# Patient Record
Sex: Male | Born: 1995 | Race: White | Hispanic: No | Marital: Single | Smoking: Never smoker
Health system: Southern US, Community
[De-identification: ages and names within clinical notes are randomized; demographics above are authoritative.]

## PROBLEM LIST (undated history)

## (undated) DIAGNOSIS — J45909 Unspecified asthma, uncomplicated: Secondary | ICD-10-CM

## (undated) DIAGNOSIS — F329 Major depressive disorder, single episode, unspecified: Secondary | ICD-10-CM

## (undated) DIAGNOSIS — F32A Depression, unspecified: Secondary | ICD-10-CM

## (undated) DIAGNOSIS — M419 Scoliosis, unspecified: Secondary | ICD-10-CM

## (undated) DIAGNOSIS — R102 Pelvic and perineal pain: Secondary | ICD-10-CM

## (undated) HISTORY — DX: Unspecified asthma, uncomplicated: J45.909

## (undated) HISTORY — DX: Depression, unspecified: F32.A

---
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
## 2017-06-07 IMAGING — US US SCROTUM
1 series · 14 of 25 positions shown · non-contrast
Comparison: No priors.

CLINICAL DATA: 19-year-old male complaining of left-sided
testicular pain today for or approximately 30-45 minutes.

EXAM:
ULTRASOUND OF SCROTUM
TECHNIQUE: Complete ultrasound examination of the testicles, epididymis, and
other scrotal structures was performed.

[Series 1: us scrotum · 0.08mm/px · 14 of 101 slices shown]
[im 1/101]
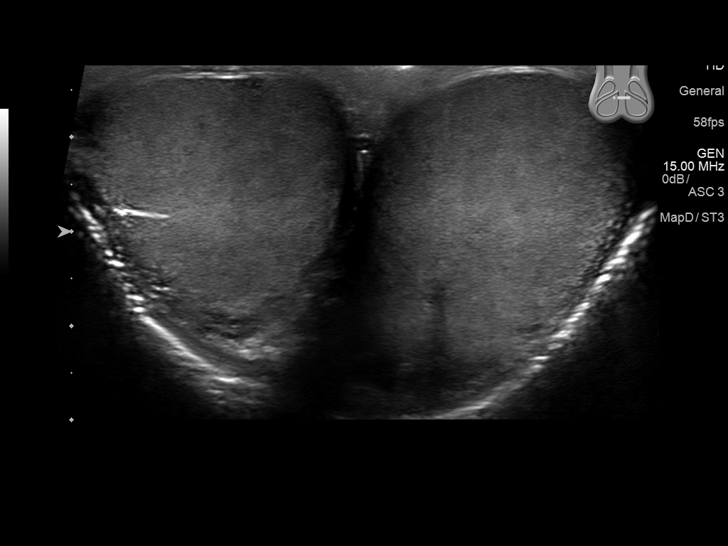
[im 9/101]
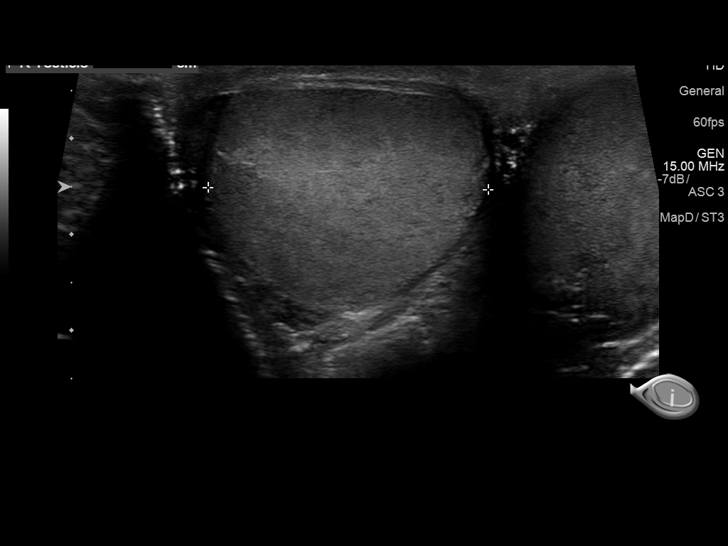
[im 17/101]
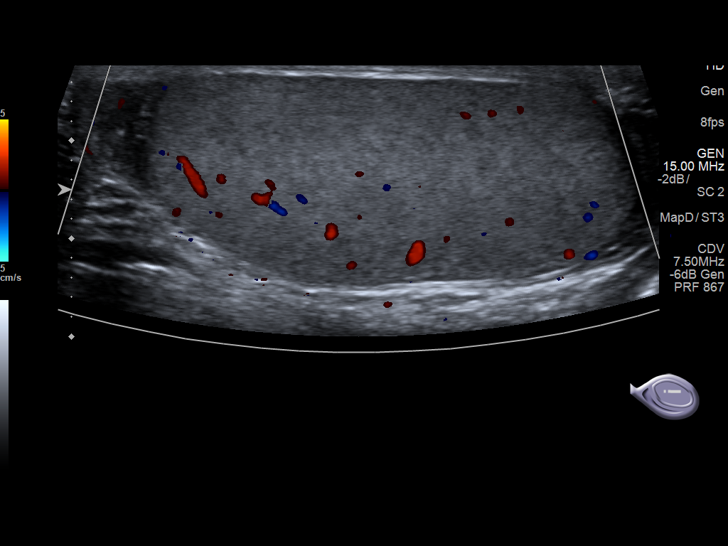
[im 26/101]
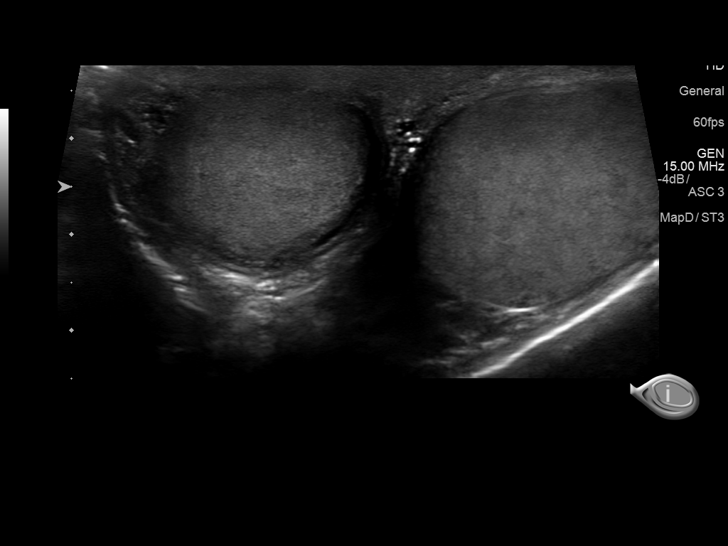
[im 34/101]
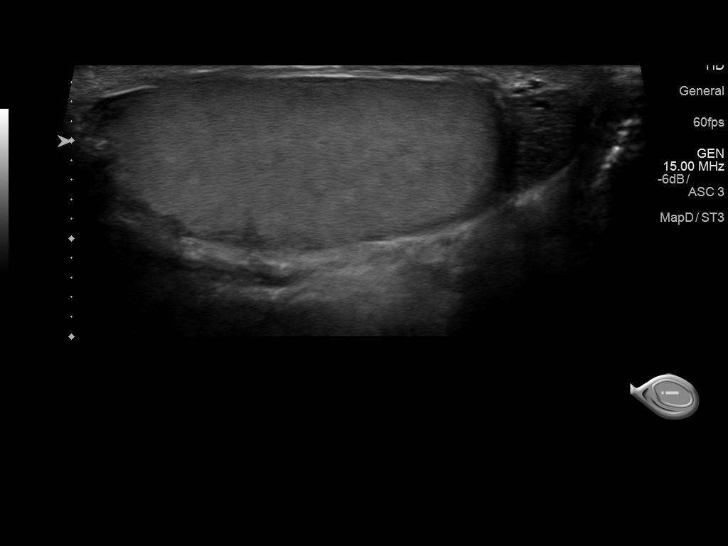
[im 38/101]
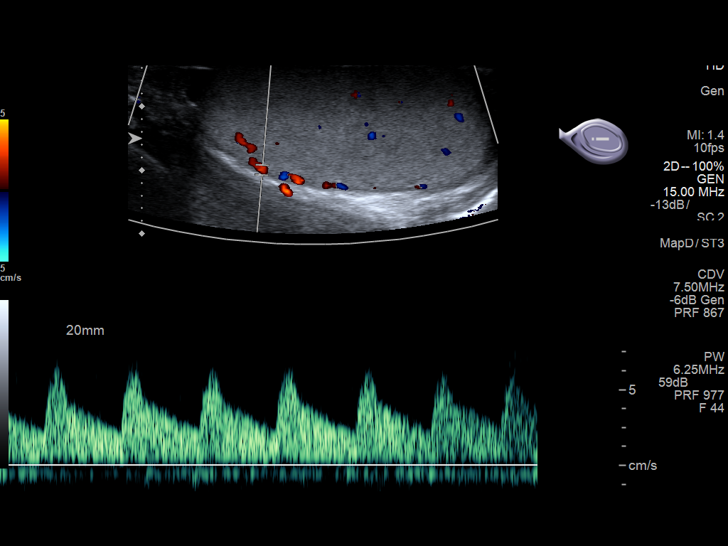
[im 46/101]
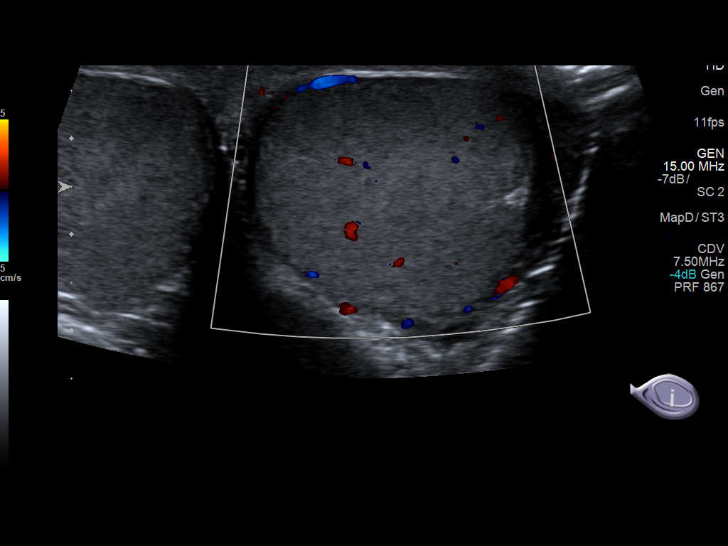
[im 55/101]
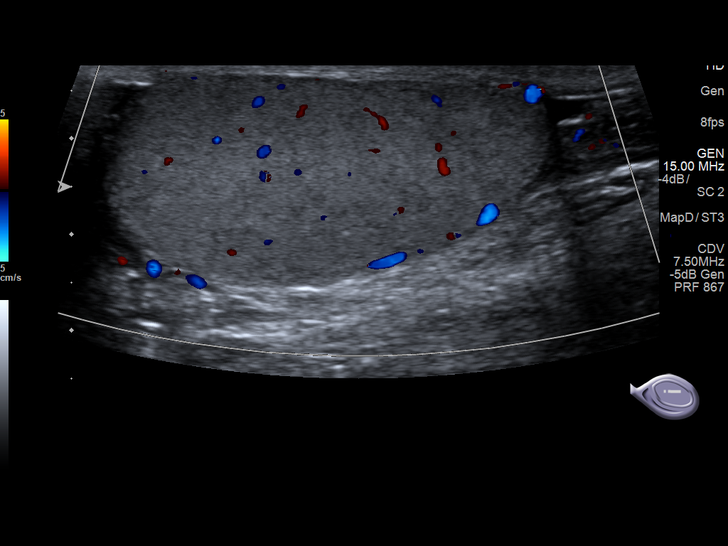
[im 63/101]
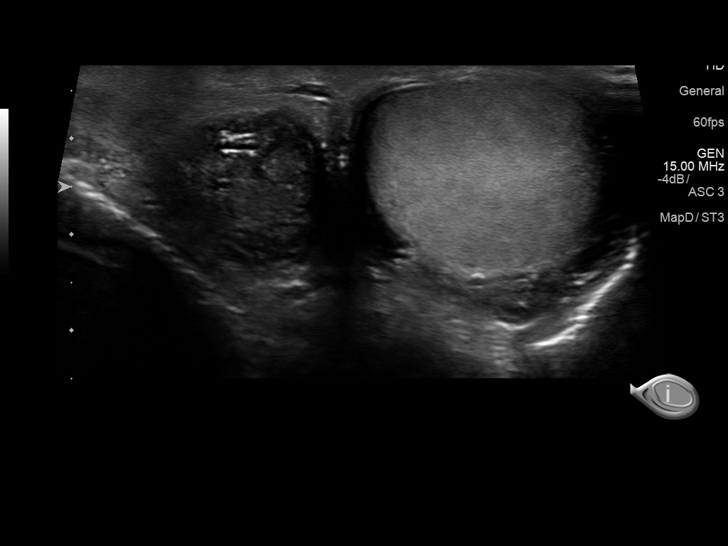
[im 67/101]
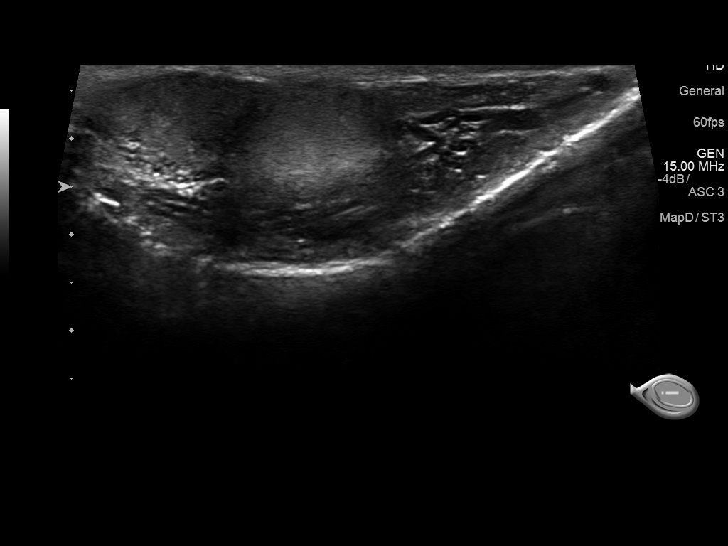
[im 76/101]
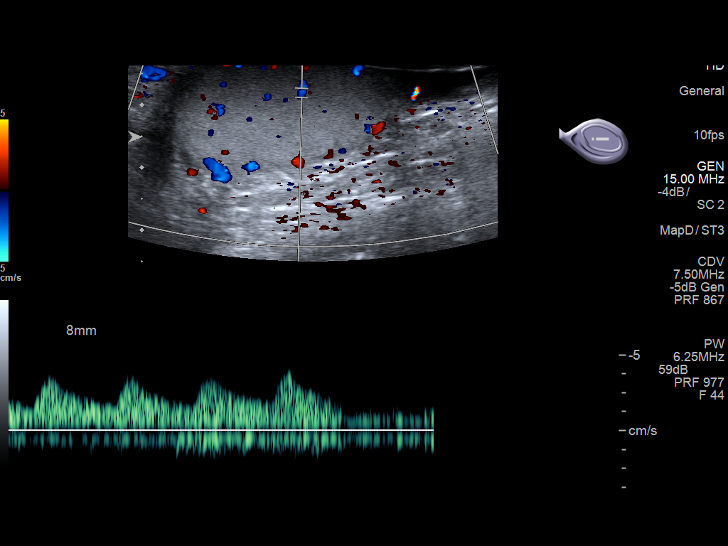
[im 84/101]
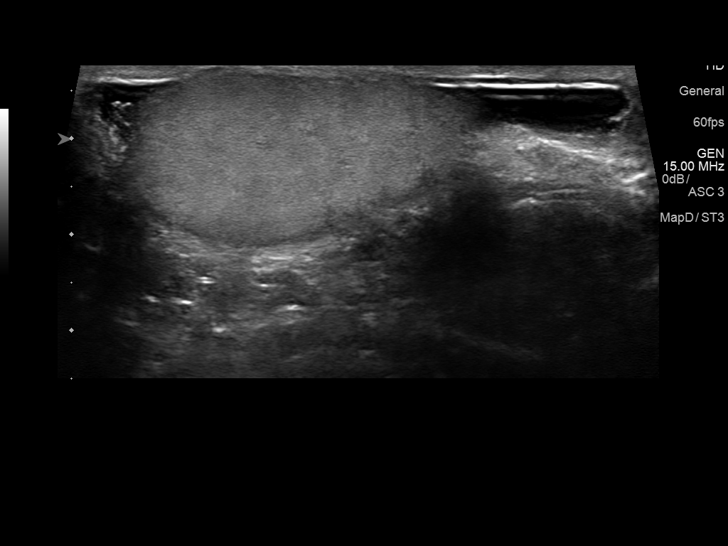
[im 92/101]
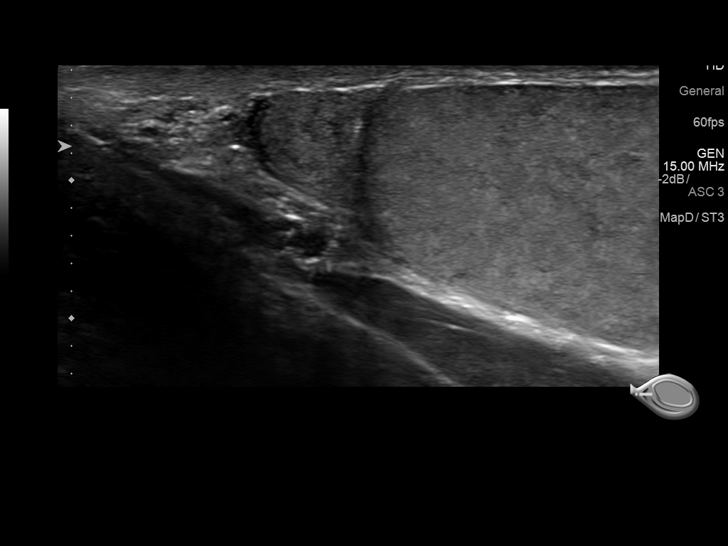
[im 101/101]
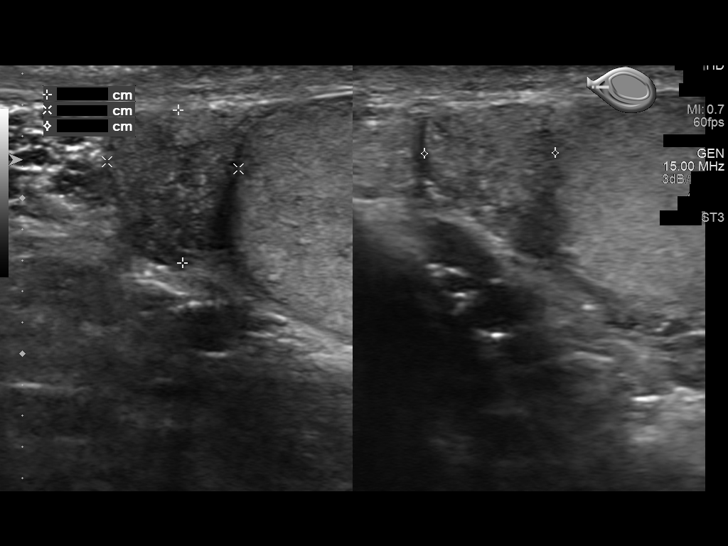

[14 of 25 positions shown; findings below may reference images not displayed]

FINDINGS: Right testicle

Measurements: 5.0 x 2.2 x 2.9 cm. No mass or microlithiasis
visualized.

Left testicle

Measurements: 4.9 x 2.1 x 3.0 cm. No mass or microlithiasis
visualized.

Right epididymis:  Normal in size and appearance.

Left epididymis:  Normal in size and appearance.

Hydrocele:  Small bilateral hydroceles (left greater than right).

Varicocele:  None visualized.
IMPRESSION: 1. Small bilateral (left greater than right) hydroceles.
2. No other acute findings. Specifically, no evidence of testicular
torsion or findings to suggest epididymo-orchitis.

## 2017-07-14 IMAGING — US US SCROTUM
1 series · 14 of 25 positions shown · non-contrast
Comparison: Ultrasound April 14, 2015.

CLINICAL DATA: Acute bilateral testicular pain, left greater than
right.

EXAM:
SCROTAL ULTRASOUND
DOPPLER ULTRASOUND OF THE TESTICLES
TECHNIQUE: Complete ultrasound examination of the testicles, epididymis, and
other scrotal structures was performed. Color and spectral Doppler
ultrasound were also utilized to evaluate blood flow to the
testicles.

[Series 1: us scrotum · 0.07mm/px · 14 of 99 slices shown]
[im 1/99]
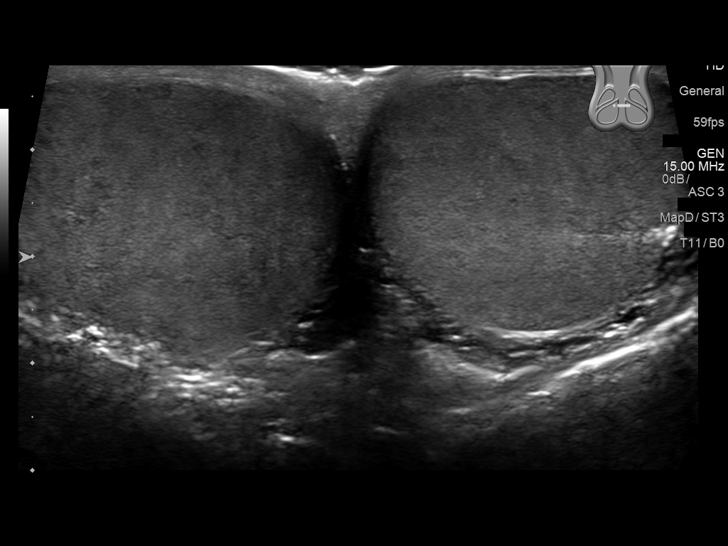
[im 9/99]
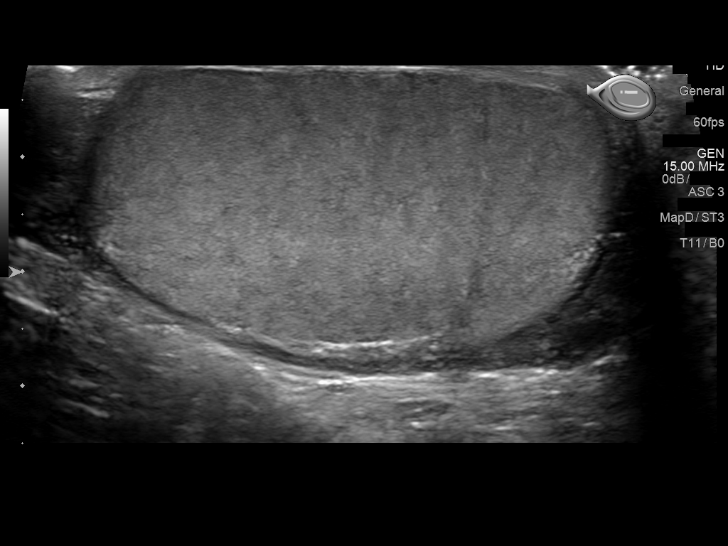
[im 17/99]
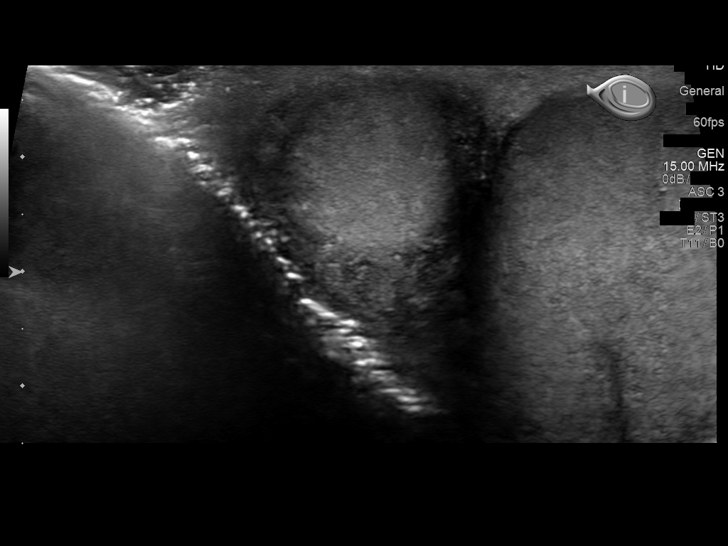
[im 25/99]
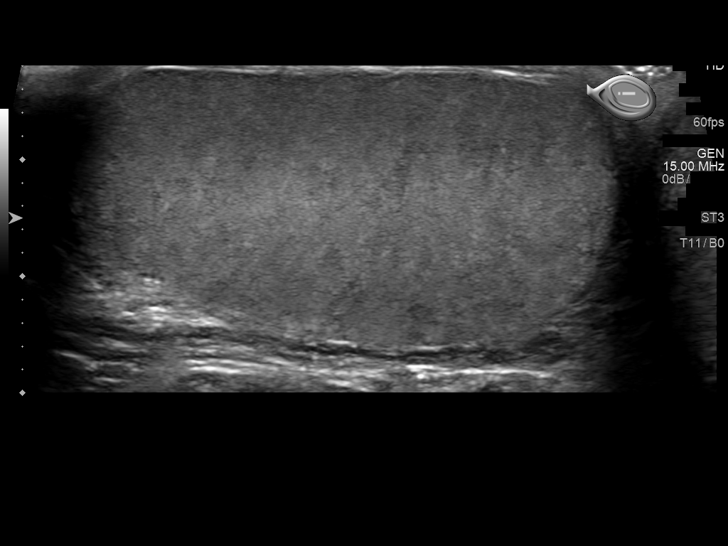
[im 33/99]
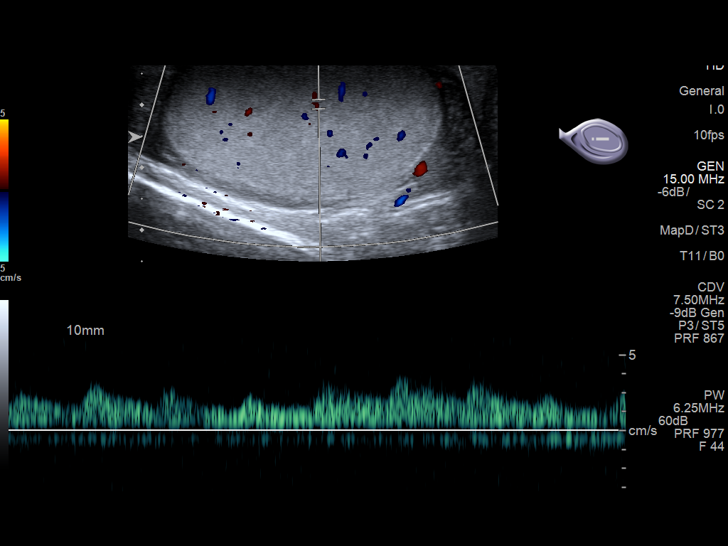
[im 37/99]
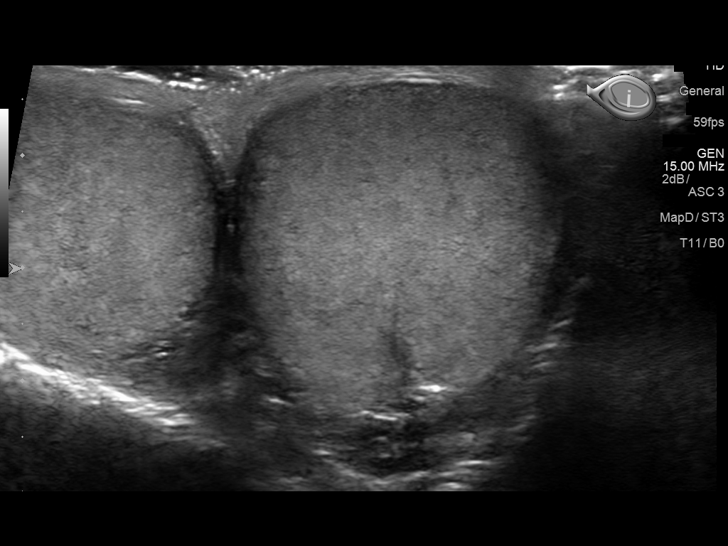
[im 45/99]
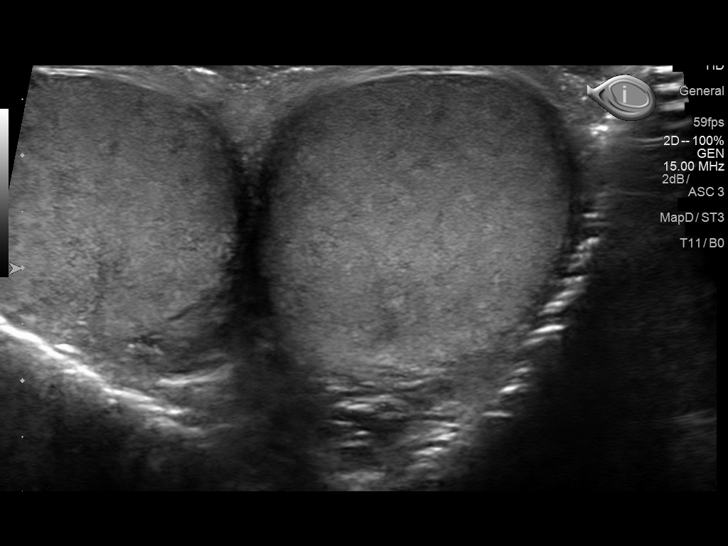
[im 54/99]
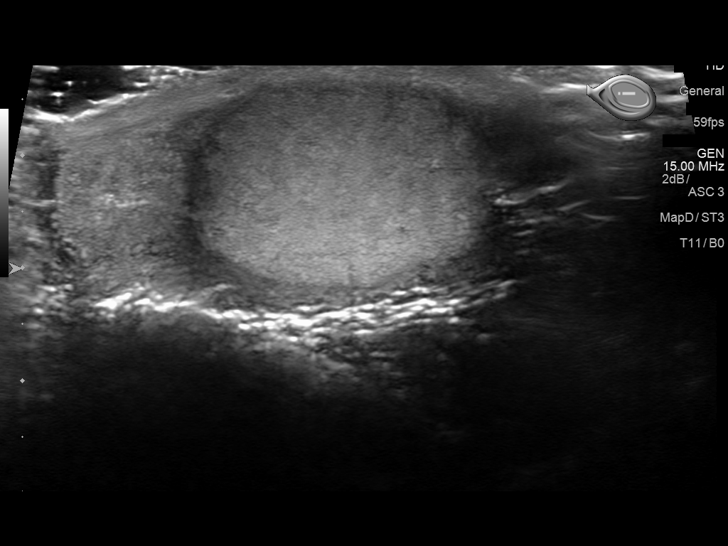
[im 62/99]
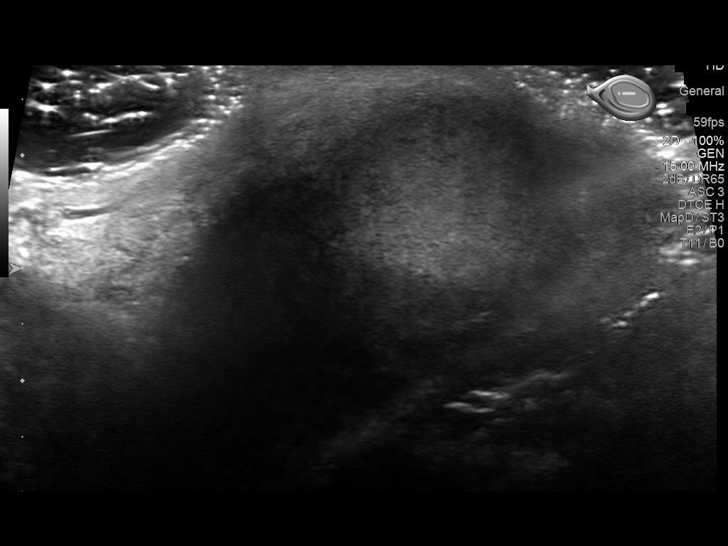
[im 66/99]
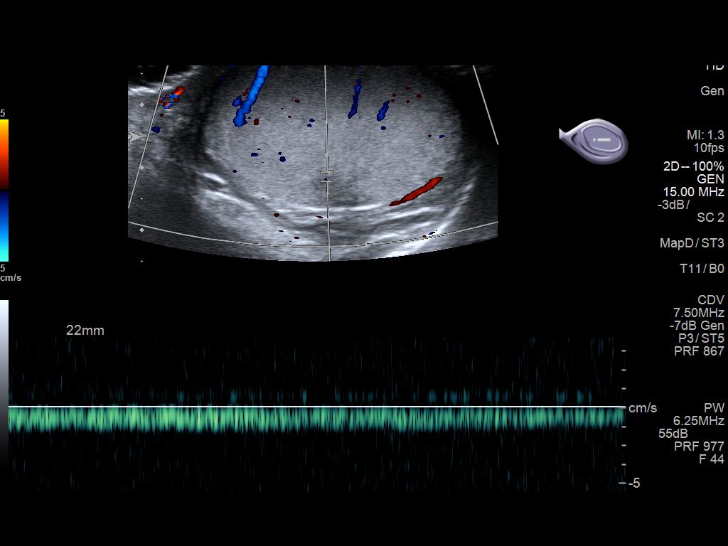
[im 74/99]
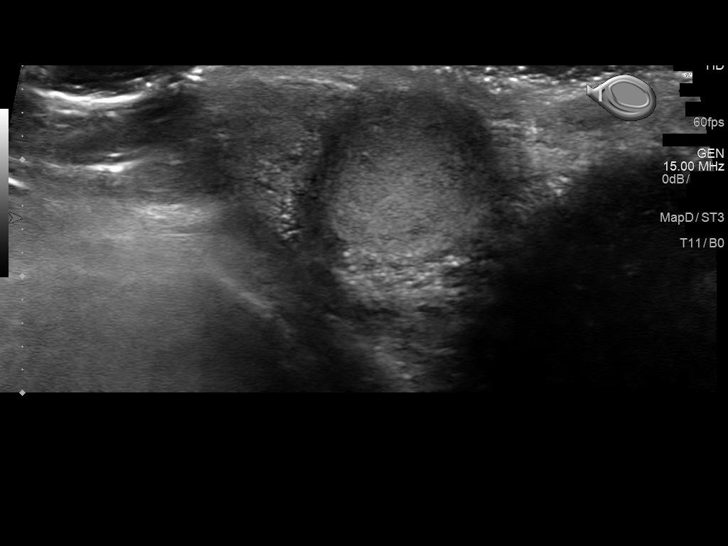
[im 82/99]
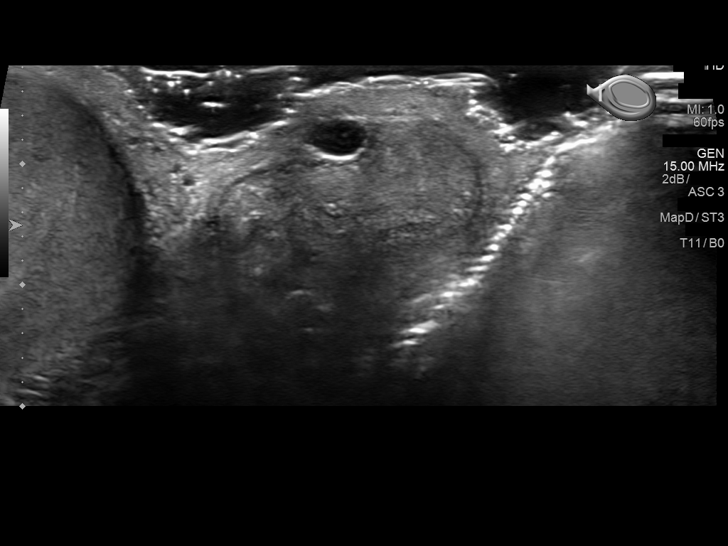
[im 90/99]
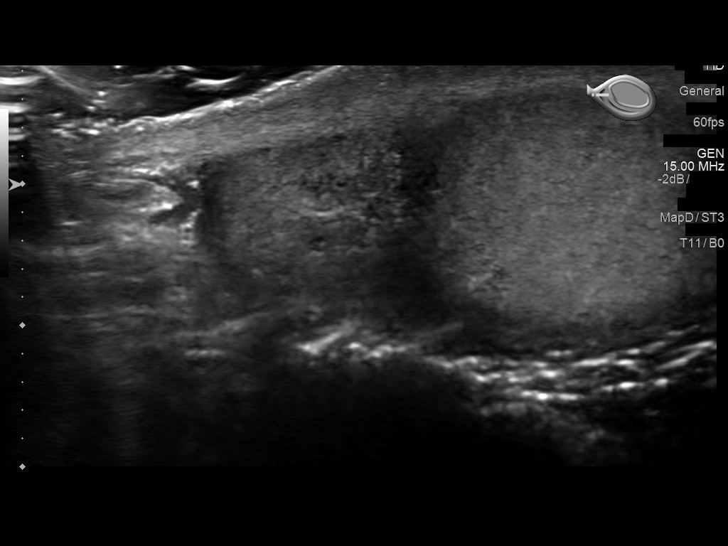
[im 99/99]
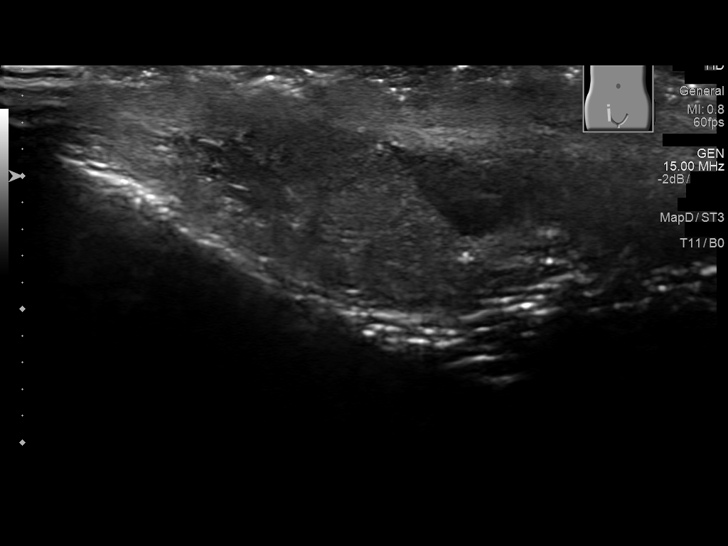

[14 of 25 positions shown; findings below may reference images not displayed]

FINDINGS: Right testicle

Measurements: 4.8 x 3.4 x 2.5 cm. No mass or microlithiasis
visualized.

Left testicle

Measurements: 4.7 x 2.9 x 2.4 cm. No mass or microlithiasis
visualized.

Right epididymis:  Normal in size and appearance.

Left epididymis:  5 mm cyst is noted.

Hydrocele:  None visualized.

Varicocele:  None visualized.

Pulsed Doppler interrogation of both testes demonstrates normal low
resistance arterial and venous waveforms bilaterally.
IMPRESSION: 5 mm left epididymal cyst. No evidence of testicular mass or
torsion.

## 2018-08-19 ENCOUNTER — Telehealth: Payer: Self-pay

## 2018-08-19 IMAGING — CR DG SCOLIOSIS EVAL COMPLETE SPINE 1V
1 series · 4 of 4 positions shown · non-contrast
Comparison: 08/12/2014

CLINICAL DATA: Evaluation for scoliosis.

EXAM:
DG SCOLIOSIS EVAL COMPLETE SPINE 1V

[Series 1: dg scoliosis eval complete spine 1 view · 0.14mm/px · 4 of 4 slices shown]
[im 1/4]
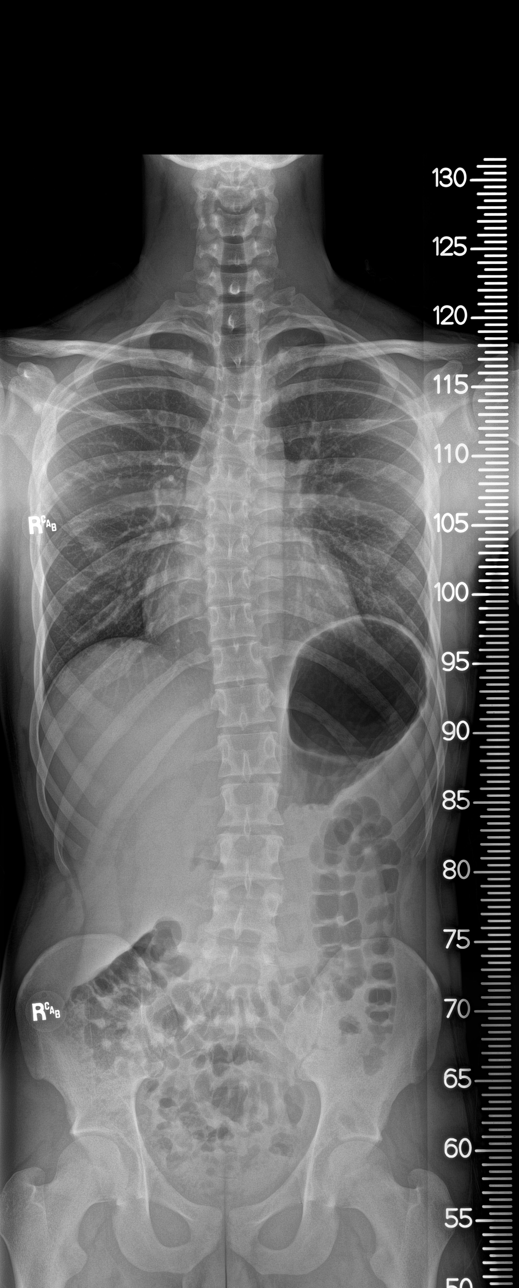
[im 2/4]
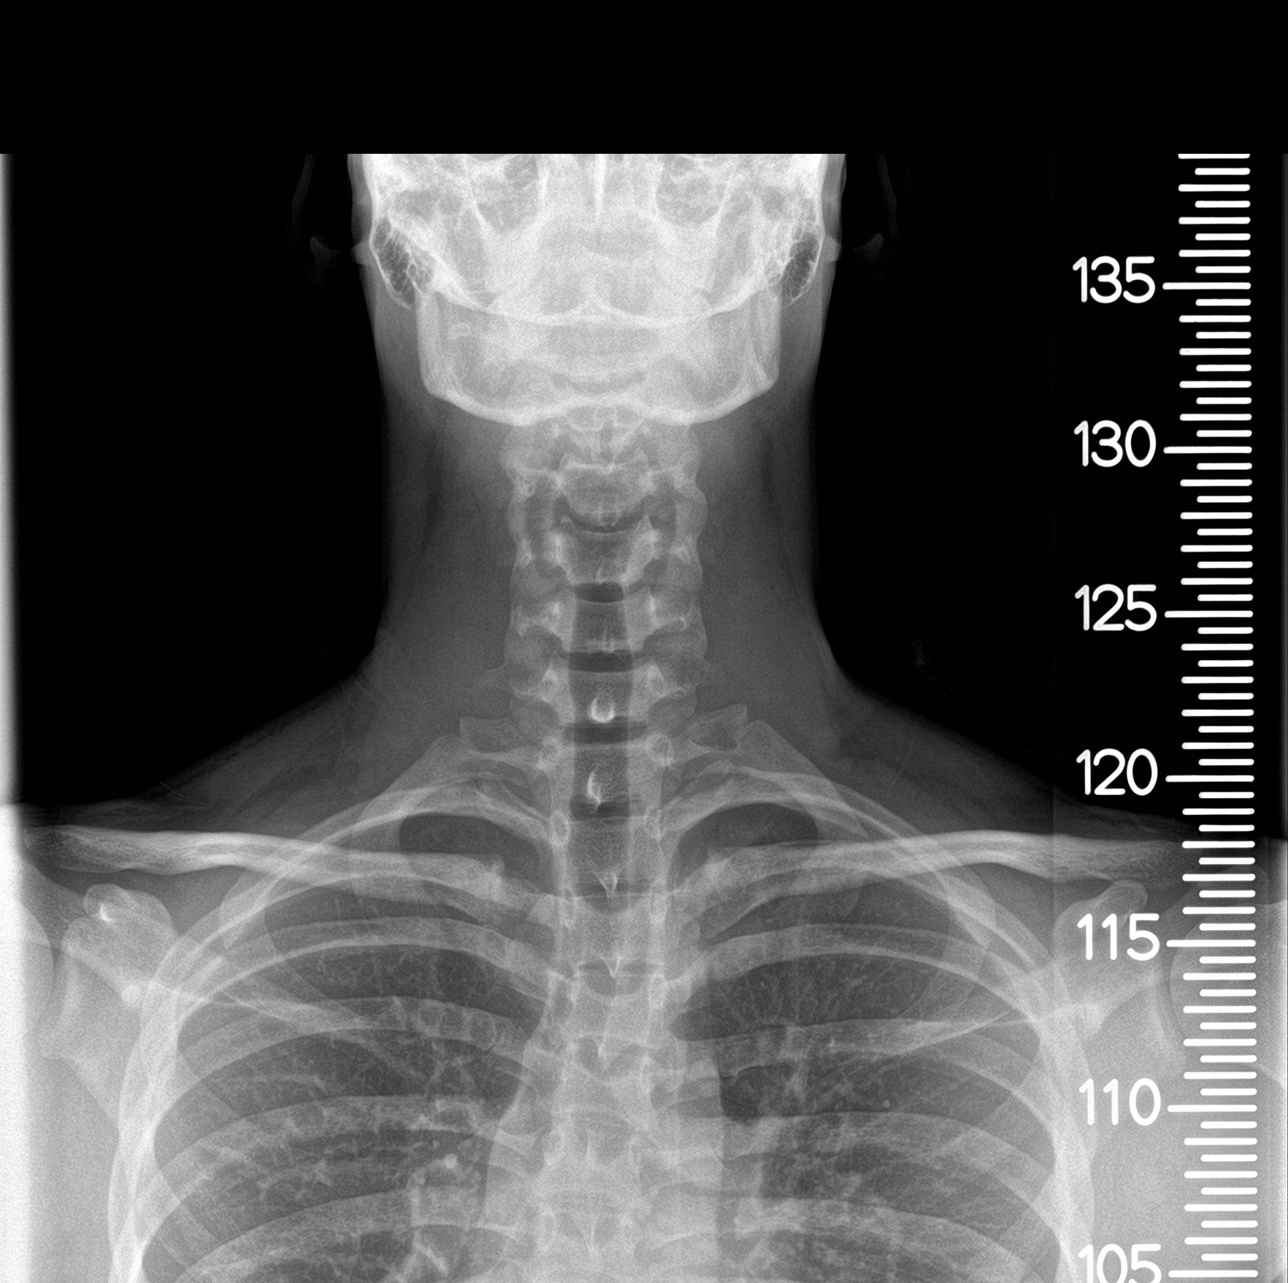
[im 3/4]
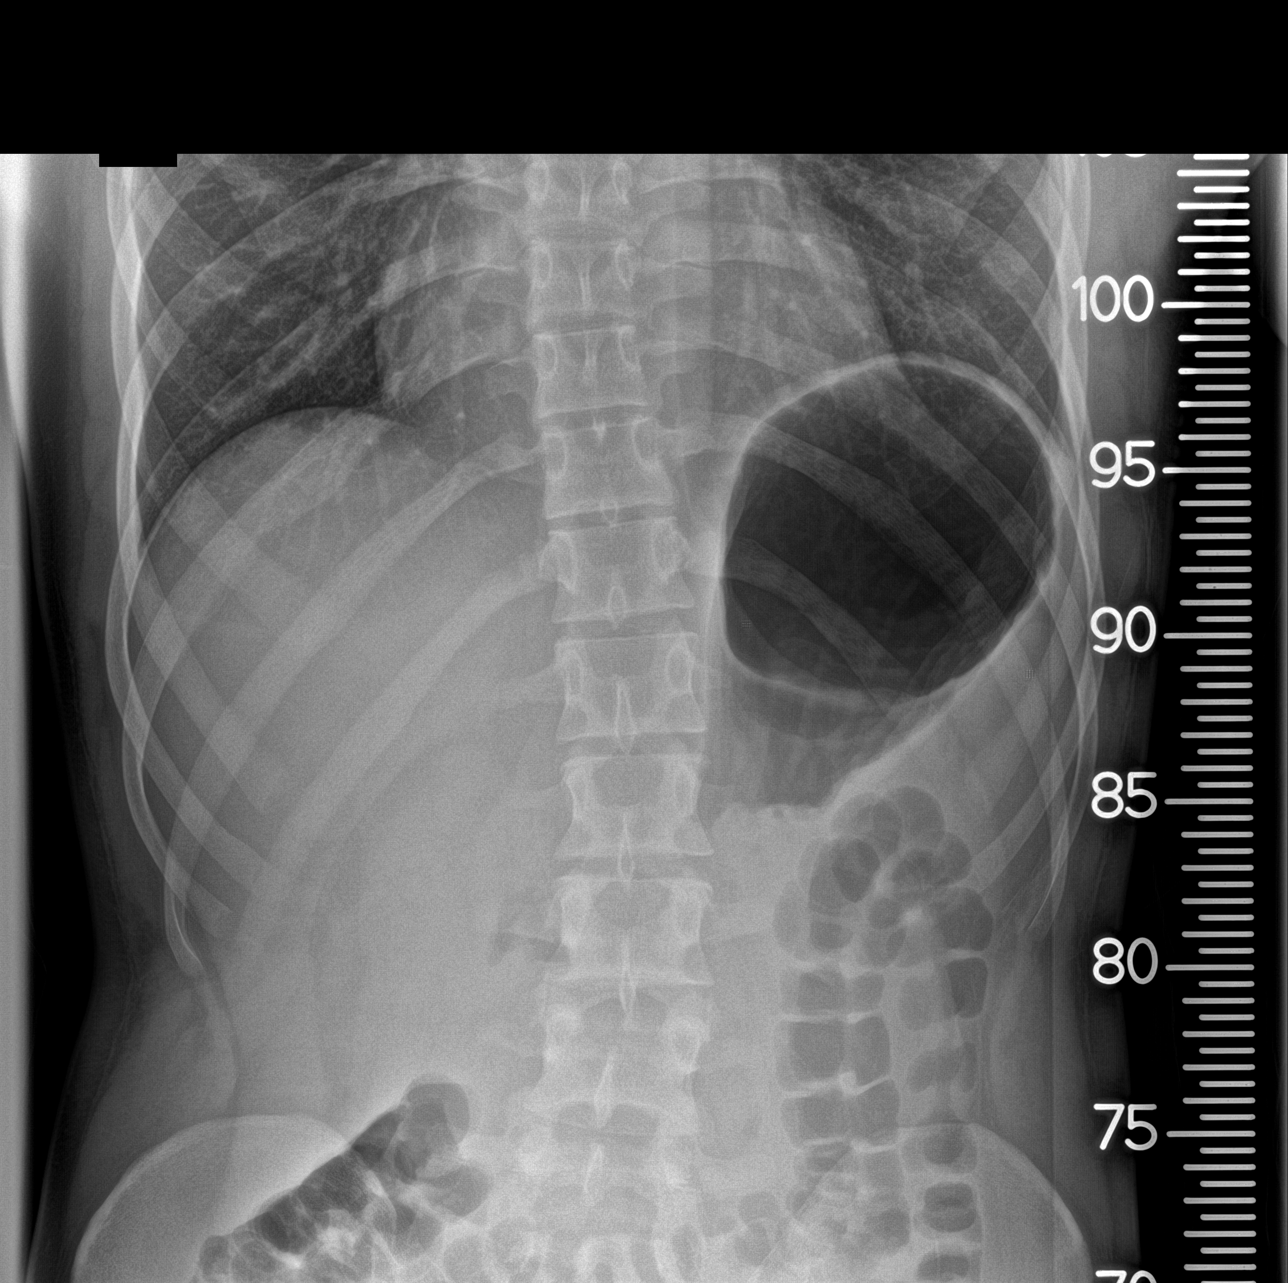
[im 4/4]
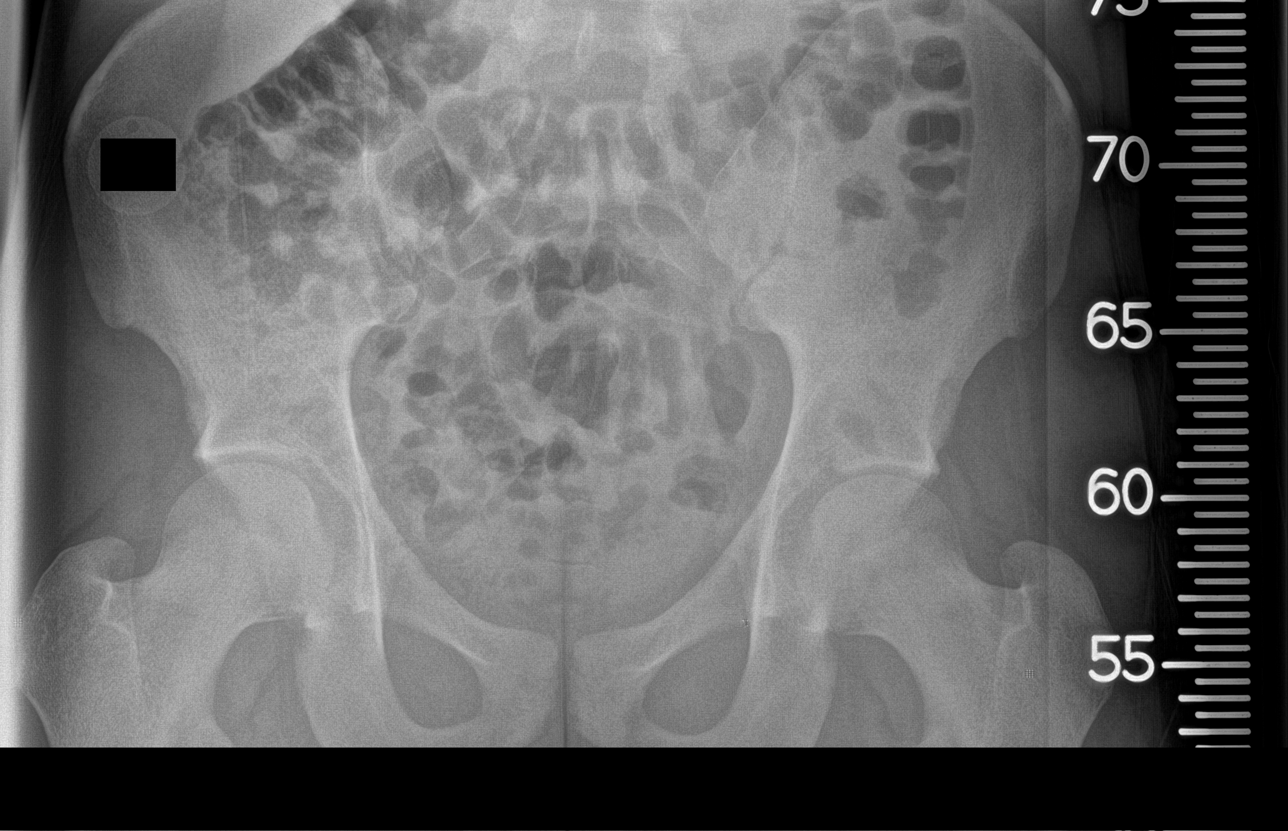

[4 of 4 positions shown; findings below may reference images not displayed]

FINDINGS: Upper thoracic curvature convex to the left with the apex at T3-4
measured at 10 degrees. Midthoracic curvature convex to the right
with the apex at T8-9 measured at 10 degrees. Lumbar curvature
convex to the left with the apex at L3-4 measured at 13 degrees.
These represent minor progression since the examination of [address] thoracic curvatures were in the region of 7-8 degrees and the
lumbar curvature was about 9 degrees.
IMPRESSION: Slight progression of spinal curvature when compared to the
examination July 2014. See above.

## 2018-08-19 NOTE — Telephone Encounter (Signed)
Attempted to contact pt.  No answer.  Vm box not set up yet.  Wanted to remind pt of OV on 08/22/18 at 9:00 AM.

## 2018-08-19 NOTE — Telephone Encounter (Signed)
I think this may be for Joshua Salinas and not me.

## 2018-08-19 NOTE — Telephone Encounter (Signed)
pts father whose name is the same as this pt said he got an automated call about an upcoming appt and pts father does not have upcoming appt on 08/22/18 and thought it might be his son. This pt contact info is [phone] and pt does have appt on 08/22/18 at 9 AM to establish as a new pt with Dr Darnell Level. Sent to [employer] at front desk.

## 2018-08-20 NOTE — Telephone Encounter (Signed)
Spoke with patient did screening for upcoming appointment

## 2018-08-22 ENCOUNTER — Ambulatory Visit: Payer: Self-pay | Admitting: Family Medicine

## 2018-08-22 ENCOUNTER — Encounter: Payer: Self-pay | Admitting: Family Medicine

## 2018-08-22 ENCOUNTER — Other Ambulatory Visit: Payer: Self-pay

## 2018-08-22 VITALS — BP 118/82 | HR 65 | Temp 98.0°F | Ht 71.0 in | Wt 165.0 lb

## 2018-08-22 DIAGNOSIS — Z8659 Personal history of other mental and behavioral disorders: Secondary | ICD-10-CM | POA: Insufficient documentation

## 2018-08-22 DIAGNOSIS — J45909 Unspecified asthma, uncomplicated: Secondary | ICD-10-CM | POA: Insufficient documentation

## 2018-08-22 DIAGNOSIS — R102 Pelvic and perineal pain: Secondary | ICD-10-CM

## 2018-08-22 DIAGNOSIS — J4520 Mild intermittent asthma, uncomplicated: Secondary | ICD-10-CM

## 2018-08-22 DIAGNOSIS — F32A Depression, unspecified: Secondary | ICD-10-CM

## 2018-08-22 DIAGNOSIS — M542 Cervicalgia: Secondary | ICD-10-CM

## 2018-08-22 DIAGNOSIS — Z0000 Encounter for general adult medical examination without abnormal findings: Secondary | ICD-10-CM

## 2018-08-22 DIAGNOSIS — F329 Major depressive disorder, single episode, unspecified: Secondary | ICD-10-CM

## 2018-08-22 NOTE — Assessment & Plan Note (Signed)
Anticipate cervical strain. Conservative measures reviewed at home. Exercise handout provided from [facility] pt advisor. rec trial mobic he has at home (7.5mg ). update if not improving, consider PT referral.

## 2018-08-22 NOTE — Assessment & Plan Note (Signed)
PHQ9 reviewed with patient. No need for meds at this time. Encouraged continue seeing counselor.

## 2018-08-22 NOTE — Progress Notes (Signed)
This visit was conducted in person.  BP 118/82 (BP Location: Left Arm, Patient Position: Sitting, Cuff Size: Normal)   Pulse 65   Temp 98 F (36.7 C) (Tympanic)   Ht 5\' 11"  (1.803 m)   Wt 165 lb (74.8 kg)   SpO2 97%   BMI 23.01 kg/m    CC: new pt to establish care Subjective:    Patient ID: Joshua Salinas, male    DOB: [birth year], 23 y.o.   MRN: [id number]  HPI: Joshua Salinas is a 23 y.o. male presenting on 08/22/2018 for New Patient (Initial Visit) (Wants to establish care. )   Intermittent neck pain for the past month attributes to poor sleep - R>L neck pain into skull. Denies inciting trauma/injury or falls. Has tried massaging with tennis ball, foam rolling with some temporary benefit. Sleeps on stomach.   H/o bilat orchalgia, pelvic pain seeing [facility] urology Dr Kirke Corin for this on flomax planned PFPT. Flomax significantly helps. Hasn't tried meloxicam.   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   Preventative: Seat belt use  Sunscreen use discussed. No changing moles on skin. Non smoker Alcohol - none No rec drugs Dentist q6 mo Eye exam - has not seen Not currently sexually active.   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     Relevant past medical, surgical, family and social history reviewed and updated as indicated. Interim medical history since our last visit reviewed. Allergies and medications reviewed and updated. Outpatient Medications Prior to Visit  Medication Sig Dispense Refill  . Albuterol Sulfate 108 (90 Base) MCG/ACT AEPB Inhale into the lungs every 4 (four) hours as needed. Inhale 1-2 puffs    . tamsulosin (FLOMAX) 0.4 MG CAPS capsule Take 1 capsule by mouth daily as needed.     No  facility-administered medications prior to visit.      Per HPI unless specifically indicated in ROS section below Review of Systems  Constitutional: Negative for activity change, appetite change, chills, fatigue, fever and unexpected weight change.  HENT: Negative for hearing loss.   Eyes: Negative for visual disturbance.  Respiratory: Negative for cough, chest tightness, shortness of breath and wheezing.   Cardiovascular: Negative for chest pain, palpitations and leg swelling.  Gastrointestinal: Negative for abdominal distention, abdominal pain, blood in stool, constipation, diarrhea, nausea and vomiting.  Genitourinary: Negative for difficulty urinating and hematuria.  Musculoskeletal: Negative for arthralgias, myalgias and neck pain.  Skin: Negative for rash.  Neurological: Negative for dizziness, seizures, syncope and headaches.  Hematological: Negative for adenopathy. Does not bruise/bleed easily.  Psychiatric/Behavioral: Negative for dysphoric mood. The patient is not nervous/anxious.    Objective:    BP 118/82 (BP Location: Left Arm, Patient Position: Sitting, Cuff Size: Normal)   Pulse 65   Temp 98 F (36.7 C) (Tympanic)   Ht 5\' 11"  (1.803 m)   Wt 165 lb (74.8 kg)   SpO2 97%   BMI 23.01 kg/m   Wt Readings from Last 3 Encounters:  08/22/18 165 lb (74.8 kg)  05/21/15 144 lb (65.3 kg) (47 %, Z= -0.08)*   * Growth percentiles are based on CDC (Boys, 2-20 Years) data.    Physical Exam Vitals signs and nursing note reviewed.  Constitutional:  General: He is not in acute distress.    Appearance: Normal appearance. He is well-developed. He is not ill-appearing.  HENT:     Head: Normocephalic and atraumatic.     Right Ear: Hearing, tympanic membrane, ear canal and external ear normal.     Left Ear: Hearing, tympanic membrane, ear canal and external ear normal.     Nose: Nose normal.     Mouth/Throat:     Mouth: Mucous membranes are moist.     Pharynx: Uvula midline.  No oropharyngeal exudate or posterior oropharyngeal erythema.  Eyes:     General: No scleral icterus.    Extraocular Movements: Extraocular movements intact.     Conjunctiva/sclera: Conjunctivae normal.     Pupils: Pupils are equal, round, and reactive to light.  Neck:     Musculoskeletal: Normal range of motion and neck supple.     Comments: FROM at neck with mild discomfort to palpation L mid trap muscle Cardiovascular:     Rate and Rhythm: Normal rate and regular rhythm.     Pulses: Normal pulses.          Radial pulses are 2+ on the right side and 2+ on the left side.     Heart sounds: Normal heart sounds. No murmur.  Pulmonary:     Effort: Pulmonary effort is normal. No respiratory distress.     Breath sounds: Normal breath sounds. No wheezing, rhonchi or rales.  Abdominal:     General: Bowel sounds are normal. There is no distension.     Palpations: Abdomen is soft. There is no mass.     Tenderness: There is no abdominal tenderness. There is no guarding or rebound.  Musculoskeletal: Normal range of motion.     Right lower leg: No edema.     Left lower leg: No edema.  Lymphadenopathy:     Cervical: No cervical adenopathy.  Skin:    General: Skin is warm and dry.     Findings: No rash.  Neurological:     General: No focal deficit present.     Mental Status: He is alert and oriented to person, place, and time.     Comments: CN grossly intact, station and gait intact  Psychiatric:        Mood and Affect: Mood normal.        Behavior: Behavior normal.        Thought Content: Thought content normal.        Judgment: Judgment normal.       Results for orders placed or performed during the hospital encounter of 08/13/14  CBC with Differential/Platelet  Result Value Ref Range   WBC 5.2 3.8 - 10.6 K/uL   RBC 5.45 4.40 - 5.90 MIL/uL   Hemoglobin 16.4 13.0 - 18.0 g/dL   HCT 78.448.4 69.640.0 - 29.552.0 %   MCV 88.8 80.0 - 100.0 fL   MCH 30.0 26.0 - 34.0 pg   MCHC 33.8 32.0 - 36.0 g/dL    RDW 28.413.0 13.211.5 - 44.014.5 %   Platelets 203 150 - 440 K/uL   Neutrophils Relative % 56 %   Neutro Abs 2.9 1.4 - 6.5 K/uL   Lymphocytes Relative 33 %   Lymphs Abs 1.7 1.0 - 3.6 K/uL   Monocytes Relative 8 %   Monocytes Absolute 0.4 0.2 - 1.0 K/uL   Eosinophils Relative 2 %   Eosinophils Absolute 0.1 0 - 0.7 K/uL   Basophils Relative 1 %   Basophils Absolute 0.0 0 -  0.1 K/uL  Lipid panel  Result Value Ref Range   Cholesterol 142 0 - 169 mg/dL   Triglycerides 960157 (H) <150 mg/dL   HDL 33 (L) >45>40 mg/dL   Total CHOL/HDL Ratio 4.3 RATIO   VLDL 31 0 - 40 mg/dL   LDL Cholesterol 78 0 - 99 mg/dL  Urinalysis complete, with microscopic ([facility] only)  Result Value Ref Range   Color, Urine YELLOW YELLOW   APPearance CLEAR CLEAR   Glucose, UA NEGATIVE NEGATIVE mg/dL   Bilirubin Urine NEGATIVE NEGATIVE   Ketones, ur NEGATIVE NEGATIVE mg/dL   Specific Gravity, Urine 1.025 1.005 - 1.030   Hgb urine dipstick NEGATIVE NEGATIVE   pH 5.5 5.0 - 8.0   Protein, ur NEGATIVE NEGATIVE mg/dL   Nitrite NEGATIVE NEGATIVE   Leukocytes, UA NEGATIVE NEGATIVE   RBC / HPF NONE SEEN <3 RBC/hpf   WBC, UA 0-5 <3 WBC/hpf   Bacteria, UA NONE SEEN (A) RARE   Squamous Epithelial / LPF 0-5 (A) RARE   Mucus PRESENT    Assessment & Plan:   Problem List Items Addressed This Visit    Pelvic pain    [facility]. Continue flomax PRN. Considering PFPT      Neck pain    Anticipate cervical strain. Conservative measures reviewed at home. [facility]. rec trial mobic he has at home (7.5mg ). update if not improving, consider PT referral.       Health maintenance examination - Primary    Preventative protocols reviewed and updated unless pt declined. Discussed healthy diet and lifestyle.       Depression    PHQ9 reviewed with patient. No need for meds at this time. Encouraged continue seeing counselor.       Asthma   Relevant Medications   Albuterol Sulfate 108  (90 Base) MCG/ACT AEPB       No orders of the defined types were placed in this encounter.  No orders of the defined types were placed in this encounter.   Follow up plan: Return in about 1 year (around 08/22/2019).  Eustaquio BoydenJavier Onesimo Lingard, MD

## 2018-08-22 NOTE — Patient Instructions (Signed)
Good to meet you today Neck stretching exercises provided today Try meloxicam with food once daily for next 5-7 days. May use heating pad to neck, covered in a towel 10 min 2-3 times a day.  Let us know if not improving with this. Return as needed or in 1 year for next physical.   Preventive Care 24-21 Years Old, Male Preventive care refers to lifestyle choices and visits with your health care provider that can promote health and wellness. At this stage in your life, you may start seeing a primary care physician instead of a pediatrician. Your health care is now your responsibility. Preventive care for young adults includes:  A yearly physical exam. This is also called an annual wellness visit.  Regular dental and eye exams.  Immunizations.  Screening for certain conditions.  Healthy lifestyle choices, such as diet and exercise. What can I expect for my preventive care visit? Physical exam Your health care provider may check:  Height and weight. These may be used to calculate body mass index (BMI), which is a measurement that tells if you are at a healthy weight.  Heart rate and blood pressure.  Body temperature. Counseling Your health care provider may ask you questions about:  Past medical problems and family medical history.  Alcohol, tobacco, and drug use.  Home and relationship well-being.  Access to firearms.  Emotional well-being.  Diet, exercise, and sleep habits.  Sexual activity and sexual health. What immunizations do I need?  Influenza (flu) vaccine  This is recommended every year. Tetanus, diphtheria, and pertussis (Tdap) vaccine  You may need a Td booster every 10 years. Varicella (chickenpox) vaccine  You may need this vaccine if you have not already been vaccinated. Human papillomavirus (HPV) vaccine  If recommended by your health care provider, you may need three doses over 6 months. Measles, mumps, and rubella (MMR) vaccine  You may need at  least one dose of MMR. You may also need a second dose. Meningococcal conjugate (MenACWY) vaccine  One dose is recommended if you are 44-70 years old and a Market researcher living in a residence hall, or if you have one of several medical conditions. You may also need additional booster doses. Pneumococcal conjugate (PCV13) vaccine  You may need this if you have certain conditions and were not previously vaccinated. Pneumococcal polysaccharide (PPSV23) vaccine  You may need one or two doses if you smoke cigarettes or if you have certain conditions. Hepatitis A vaccine  You may need this if you have certain conditions or if you travel or work in places where you may be exposed to hepatitis A. Hepatitis B vaccine  You may need this if you have certain conditions or if you travel or work in places where you may be exposed to hepatitis B. Haemophilus influenzae type b (Hib) vaccine  You may need this if you have certain risk factors. You may receive vaccines as individual doses or as more than one vaccine together in one shot (combination vaccines). Talk with your health care provider about the risks and benefits of combination vaccines. What tests do I need? Blood tests  Lipid and cholesterol levels. These may be checked every 5 years starting at age 60.  Hepatitis C test.  Hepatitis B test. Screening  Genital exam to check for testicular cancer or hernias.  Sexually transmitted disease (STD) testing, if you are at risk. Other tests  Tuberculosis skin test.  Vision and hearing tests.  Skin exam. Follow these instructions  at home: Eating and drinking   Eat a diet that includes fresh fruits and vegetables, whole grains, lean protein, and low-fat dairy products.  Drink enough fluid to keep your urine pale yellow.  Do not drink alcohol if: ? Your health care provider tells you not to drink. ? You are under the legal drinking age. In the [place] the legal drinking  age is 13.  If you drink alcohol: ? Limit how much you have to 0-2 drinks a day. ? Be aware of how much alcohol is in your drink. In the [place] one drink equals one 12 oz bottle of beer (355 mL), one 5 oz glass of wine (148 mL), or one 1 oz glass of hard liquor (44 mL). Lifestyle  Take daily care of your teeth and gums.  Stay active. Exercise at least 30 minutes 5 or more days of the week.  Do not use any products that contain nicotine or tobacco, such as cigarettes, e-cigarettes, and chewing tobacco. If you need help quitting, ask your health care provider.  Do not use drugs.  If you are sexually active, practice safe sex. Use a condom or other form of protection to prevent STIs (sexually transmitted infections).  Find healthy ways to cope with stress, such as: ? Meditation, yoga, or listening to music. ? Journaling. ? Talking to a trusted person. ? Spending time with friends and family. Safety  Always wear your seat belt while driving or riding in a vehicle.  Do not drive if you have been drinking alcohol.  Do not ride with someone who has been drinking.  Do not drive when you are tired or distracted.  Do not text while driving.  Wear a helmet and other protective equipment during sports activities.  If you have firearms in your house, make sure you follow all gun safety procedures.  Seek help if you have been bullied, physically abused, or sexually abused.  Use the Internet responsibly to avoid dangers such as online bullying and online sex predators. What's next?  Go to your health care provider once a year for a well check visit.  Ask your health care provider how often you should have your eyes and teeth checked.  Stay up to date on all vaccines. This information is not intended to replace advice given to you by your health care provider. Make sure you discuss any questions you have with your health care provider. Document Released: 06/17/2015 Document Revised:  01/24/2018 Document Reviewed: 01/24/2018 Elsevier Patient Education  2020 [employer].

## 2018-08-22 NOTE — Assessment & Plan Note (Signed)
Preventative protocols reviewed and updated unless pt declined. Discussed healthy diet and lifestyle.  

## 2018-08-22 NOTE — Assessment & Plan Note (Addendum)
Has seen [facility]) urology. Continue flomax PRN. Considering PFPT

## 2018-08-28 ENCOUNTER — Encounter: Payer: Self-pay | Admitting: Family Medicine

## 2018-08-28 DIAGNOSIS — M419 Scoliosis, unspecified: Secondary | ICD-10-CM

## 2018-08-28 DIAGNOSIS — M4120 Other idiopathic scoliosis, site unspecified: Secondary | ICD-10-CM | POA: Insufficient documentation

## 2018-08-28 HISTORY — DX: Scoliosis, unspecified: M41.9

## 2019-09-17 ENCOUNTER — Telehealth: Payer: Self-pay | Admitting: Family Medicine

## 2019-09-17 NOTE — Telephone Encounter (Signed)
Ok to schedule. No need for labs prior.

## 2019-09-17 NOTE — Telephone Encounter (Signed)
Patient called and needed to get scheduled for CPE and labs. He starts school 10/01/19 and will have class everyday Monday-Friday so it would be extremely hard for him to have time once school starts. There is a 11:30 slot available on 8/13 would it be okay to schedule him there? He is willing to come in for labs ASAP.

## 2019-09-26 ENCOUNTER — Other Ambulatory Visit: Payer: Self-pay

## 2019-09-26 ENCOUNTER — Encounter: Payer: Self-pay | Admitting: Family Medicine

## 2019-09-26 ENCOUNTER — Ambulatory Visit: Payer: Self-pay | Admitting: Family Medicine

## 2019-09-26 VITALS — BP 118/78 | HR 67 | Temp 98.2°F | Ht 71.0 in | Wt 164.0 lb

## 2019-09-26 DIAGNOSIS — Z13220 Encounter for screening for lipoid disorders: Secondary | ICD-10-CM

## 2019-09-26 DIAGNOSIS — R102 Pelvic and perineal pain: Secondary | ICD-10-CM

## 2019-09-26 DIAGNOSIS — Z113 Encounter for screening for infections with a predominantly sexual mode of transmission: Secondary | ICD-10-CM

## 2019-09-26 DIAGNOSIS — Z0000 Encounter for general adult medical examination without abnormal findings: Secondary | ICD-10-CM

## 2019-09-26 DIAGNOSIS — H547 Unspecified visual loss: Secondary | ICD-10-CM | POA: Insufficient documentation

## 2019-09-26 DIAGNOSIS — Z23 Encounter for immunization: Secondary | ICD-10-CM

## 2019-09-26 DIAGNOSIS — J4520 Mild intermittent asthma, uncomplicated: Secondary | ICD-10-CM

## 2019-09-26 DIAGNOSIS — Z131 Encounter for screening for diabetes mellitus: Secondary | ICD-10-CM

## 2019-09-26 DIAGNOSIS — M41125 Adolescent idiopathic scoliosis, thoracolumbar region: Secondary | ICD-10-CM

## 2019-09-26 LAB — BASIC METABOLIC PANEL
BUN: 16 mg/dL (ref 6–23)
CO2: 32 mEq/L (ref 19–32)
Calcium: 10.1 mg/dL (ref 8.4–10.5)
Chloride: 102 mEq/L (ref 96–112)
Creatinine, Ser: 1.06 mg/dL (ref 0.40–1.50)
GFR: 87.44 mL/min (ref >60.00)
Glucose, Bld: 98 mg/dL (ref 70–99)
Potassium: 4.1 mEq/L (ref 3.5–5.1)
Sodium: 140 mEq/L (ref 135–145)

## 2019-09-26 LAB — LIPID PANEL
Cholesterol: 166 mg/dL (ref 0–200)
HDL: 35.7 mg/dL — ABNORMAL LOW
LDL Cholesterol: 93 mg/dL (ref 0–99)
NonHDL: 130.15
Total CHOL/HDL Ratio: 5
Triglycerides: 186 mg/dL — ABNORMAL HIGH (ref 0.0–149.0)
VLDL: 37.2 mg/dL (ref 0.0–40.0)

## 2019-09-26 MED ORDER — ALBUTEROL SULFATE HFA 108 (90 BASE) MCG/ACT IN AERS: 2.0000 | INHALATION_SPRAY | Freq: Four times a day (QID) | RESPIRATORY_TRACT | 3 refills | Status: AC | PRN

## 2019-09-26 NOTE — Assessment & Plan Note (Addendum)
Failed L vision screen - advised he go for formal eye exam to local optometrist as may need glasses.

## 2019-09-26 NOTE — Assessment & Plan Note (Addendum)
Preventative protocols reviewed and updated unless pt declined. Discussed healthy diet and lifestyle.  STD screen today Discussed safe sex.

## 2019-09-26 NOTE — Assessment & Plan Note (Signed)
Exercise/cold induced, rare albuterol - current Rx is expired. Will refill to update at pharmacy.

## 2019-09-26 NOTE — Progress Notes (Signed)
This visit was conducted in person.  BP 118/78 (BP Location: Left Arm, Patient Position: Sitting, Cuff Size: Normal)   Pulse 67   Temp 98.2 F (36.8 C) (Temporal)   Ht 5\' 11"  (1.803 m)   Wt 164 lb (74.4 kg)   SpO2 98%   BMI 22.87 kg/m    Hearing Screening   125Hz  250Hz  500Hz  1000Hz  2000Hz  3000Hz  4000Hz  6000Hz  8000Hz   Right ear:           Left ear:             Visual Acuity Screening   Right eye Left eye Both eyes  Without correction: 20/25 20/50 20/30   With correction:       CC: CPE Subjective:    Patient ID: Joshua Salinas, male    DOB: Oct 06, 1995, 24 y.o.   MRN:  HPI: Joshua Salinas is a 24 y.o. male presenting on 09/26/2019 for Annual Exam   H/o chronic pelvic pain saw [facility]) managed with flomax PRN meloxicam and PFPT. Now only rare flares - managed with flomax PRN.   Starting school next week [employer] - sophomore to . To live in an apartment with roommate. Took the past 18 months off. Traveled country in a [place] with cousins, worked at [employer], worked at 11/[employer].   H/o thoracic scoliosis last measured 3 yrs ago. Max 10 degree curvature noted at that time. No known fmhx scoliosis. He has started shaving regularly. No h/o leg trauma or known leg length discrepancy.  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   Preventative: Tdap 09/2019  COVID vaccine - discussed, declines at this time  No texting and driving.  Seat belt use discussed.  Sunscreen use discussed. No changing moles on skin. Non smoker  Alcohol - rare, no binging  No rec drugs  Dentist q6 mo Eye exam - has not seen - notes more trouble with distance vision  Not currently sexually active. 1 partner in the past year. No recent STD screen - agrees today.   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 Interim medical history since our last visit reviewed. Allergies and medications reviewed and updated. Outpatient Medications Prior to Visit  Medication Sig Dispense Refill  . meloxicam (MOBIC) 7.5 MG tablet Take 7.5 mg by mouth daily as needed for pain.    . tamsulosin (FLOMAX) 0.4 MG CAPS capsule Take 1 capsule by mouth daily as needed.    . Albuterol Sulfate 108 (90 Base) MCG/ACT AEPB Inhale into the lungs every 4 (four) hours as needed. Inhale 1-2 puffs     No facility-administered medications prior to visit.     Per HPI unless specifically indicated in ROS section below Review of Systems  Constitutional: Negative for activity change, appetite change, chills, fatigue, fever and unexpected weight change.  HENT: Negative for hearing loss.   Eyes: Negative for visual disturbance.  Respiratory: Negative for cough, chest tightness, shortness of breath and wheezing.   Cardiovascular: Negative for chest pain, palpitations and leg swelling.  Gastrointestinal: Negative for abdominal distention, abdominal pain, blood in stool, constipation, diarrhea, nausea and vomiting.  Genitourinary:  Negative for difficulty urinating and hematuria.  Musculoskeletal: Negative for arthralgias, myalgias and neck pain.  Skin: Negative for rash.  Neurological: Negative for dizziness, seizures, syncope and headaches.  Hematological: Negative for adenopathy. Does not bruise/bleed easily.  Psychiatric/Behavioral: Negative for dysphoric mood. The patient is not nervous/anxious.    Objective:  BP 118/78 (BP Location: Left Arm, Patient Position: Sitting, Cuff Size: Normal)   Pulse 67   Temp 98.2 F (36.8 C) (Temporal)   Ht 5\' 11"  (1.803 m)   Wt 164 lb (74.4 kg)   SpO2 98%   BMI 22.87 kg/m   Wt Readings from Last 3  Encounters:  09/26/19 164 lb (74.4 kg)  08/22/18 165 lb (74.8 kg)  05/21/15 144 lb (65.3 kg) (47 %, Z= -0.08)*   * Growth percentiles are based on CDC (Boys, 2-20 Years) data.      Physical Exam Vitals and nursing note reviewed.  Constitutional:      General: He is not in acute distress.    Appearance: Normal appearance. He is well-developed. He is not ill-appearing.  HENT:     Head: Normocephalic and atraumatic.     Right Ear: Hearing, tympanic membrane, ear canal and external ear normal.     Left Ear: Hearing, tympanic membrane, ear canal and external ear normal.     Mouth/Throat:     Pharynx: Uvula midline.  Eyes:     General: No scleral icterus.    Extraocular Movements: Extraocular movements intact.     Conjunctiva/sclera: Conjunctivae normal.     Pupils: Pupils are equal, round, and reactive to light.  Cardiovascular:     Rate and Rhythm: Normal rate and regular rhythm.     Pulses: Normal pulses.          Radial pulses are 2+ on the right side and 2+ on the left side.     Heart sounds: Normal heart sounds. No murmur heard.   Pulmonary:     Effort: Pulmonary effort is normal. No respiratory distress.     Breath sounds: Normal breath sounds. No wheezing, rhonchi or rales.  Abdominal:     General: Abdomen is flat. Bowel sounds are normal. There is no distension.     Palpations: Abdomen is soft. There is no mass.     Tenderness: There is no abdominal tenderness. There is no guarding or rebound.     Hernia: No hernia is present.  Musculoskeletal:        General: Normal range of motion.     Cervical back: Normal range of motion and neck supple.     Right lower leg: No edema.     Left lower leg: No edema.  Lymphadenopathy:     Cervical: No cervical adenopathy.  Skin:    General: Skin is warm and dry.     Findings: No rash.  Neurological:     General: No focal deficit present.     Mental Status: He is alert and oriented to person, place, and time.     Comments: CN  grossly intact, station and gait intact  Psychiatric:        Mood and Affect: Mood normal.        Behavior: Behavior normal.        Thought Content: Thought content normal.        Judgment: Judgment normal.       Results for orders placed or performed in visit on 08/28/18  CBC and differential  Result Value Ref Range  Hemoglobin 45.6 (A) 13.5 - 17.5   Platelets 205 150 - 399   WBC 5.1   Lipid panel  Result Value Ref Range   Triglycerides 143 40 - 160   Cholesterol 164 0 - 200   HDL 31 (A) 35 - 70   LDL Cholesterol 104    Assessment & Plan:  This visit occurred during the SARS-CoV-2 public health emergency.  Safety protocols were in place, including screening questions prior to the visit, additional usage of staff PPE, and extensive cleaning of exam room while observing appropriate contact time as indicated for disinfecting solutions.   Problem List Items Addressed This Visit    Pelvic pain    Sees urology , managed with PRN flomax with benefit.       Idiopathic scoliosis    Mild. No significant scoliosis noted on exam. Low chance of progression given age. Will monitor for now, to let me know if worsening thoracic back pain. Encouraged good ergonomics at work.       Health maintenance examination - Primary    Preventative protocols reviewed and updated unless pt declined. Discussed healthy diet and lifestyle.  STD screen today Discussed safe sex.       Decreased visual acuity    Failed L vision screen - advised he go for formal eye exam to local optometrist as may need glasses.      Asthma    Exercise/cold induced, rare albuterol - current Rx is expired. Will refill to update at pharmacy.       Relevant Medications   albuterol (VENTOLIN HFA) 108 (90 Base) MCG/ACT inhaler    Other Visit Diagnoses    Need for Tdap vaccination       Relevant Orders   Tdap vaccine greater than or equal to 7yo IM (Completed)   Lipid screening       Relevant Orders   Lipid panel    Diabetes mellitus screening       Relevant Orders   Basic metabolic panel   Screen for STD (sexually transmitted disease)       Relevant Orders   HIV Antibody (routine testing w rflx)   RPR       Meds ordered this encounter  Medications  . albuterol (VENTOLIN HFA) 108 (90 Base) MCG/ACT inhaler    Sig: Inhale 2 puffs into the lungs every 6 (six) hours as needed for wheezing or shortness of breath.    Dispense:  8 g    Refill:  3   Orders Placed This Encounter  Procedures  . Tdap vaccine greater than or equal to 7yo IM  . Lipid panel  . Basic metabolic panel  . HIV Antibody (routine testing w rflx)  . RPR    Patient instructions: Tdap today.  Vision screen today.  Labs today.  We can monitor scoliosis for now - but let me know if new/worsening back pain.  Good luck at school!  Albuterol inhaler refilled to have an updated Rx at pharmacy.  Return as needed or in 1 year for next physical.   Follow up plan: Return if symptoms worsen or fail to improve.  Eustaquio Boyden, MD

## 2019-09-26 NOTE — Assessment & Plan Note (Signed)
Sees urology , managed with PRN flomax with benefit.

## 2019-09-26 NOTE — Assessment & Plan Note (Signed)
Mild. No significant scoliosis noted on exam. Low chance of progression given age. Will monitor for now, to let me know if worsening thoracic back pain. Encouraged good ergonomics at work.

## 2019-09-26 NOTE — Patient Instructions (Addendum)
Tdap today.  Vision screen today.  Labs today.  We can monitor scoliosis for now - but let me know if new/worsening back pain.  Good luck at school!  Albuterol inhaler refilled to have an updated Rx at pharmacy.  Return as needed or in 1 year for next physical.   Preventive Care 43-22 Years Old, Male Preventive care refers to lifestyle choices and visits with your health care provider that can promote health and wellness. At this stage in your life, you may start seeing a primary care physician instead of a pediatrician. Your health care is now your responsibility. Preventive care for young adults includes:  A yearly physical exam. This is also called an annual wellness visit.  Regular dental and eye exams.  Immunizations.  Screening for certain conditions.  Healthy lifestyle choices, such as diet and exercise. What can I expect for my preventive care visit? Physical exam Your health care provider may check:  Height and weight. These may be used to calculate body mass index (BMI), which is a measurement that tells if you are at a healthy weight.  Heart rate and blood pressure.  Body temperature. Counseling Your health care provider may ask you questions about:  Past medical problems and family medical history.  Alcohol, tobacco, and drug use.  Home and relationship well-being.  Access to firearms.  Emotional well-being.  Diet, exercise, and sleep habits.  Sexual activity and sexual health. What immunizations do I need?  Influenza (flu) vaccine  This is recommended every year. Tetanus, diphtheria, and pertussis (Tdap) vaccine  You may need a Td booster every 10 years. Varicella (chickenpox) vaccine  You may need this vaccine if you have not already been vaccinated. Human papillomavirus (HPV) vaccine  If recommended by your health care provider, you may need three doses over 6 months. Measles, mumps, and rubella (MMR) vaccine  You may need at least one dose  of MMR. You may also need a second dose. Meningococcal conjugate (MenACWY) vaccine  One dose is recommended if you are 5-84 years old and a Market researcher living in a residence hall, or if you have one of several medical conditions. You may also need additional booster doses. Pneumococcal conjugate (PCV13) vaccine  You may need this if you have certain conditions and were not previously vaccinated. Pneumococcal polysaccharide (PPSV23) vaccine  You may need one or two doses if you smoke cigarettes or if you have certain conditions. Hepatitis A vaccine  You may need this if you have certain conditions or if you travel or work in places where you may be exposed to hepatitis A. Hepatitis B vaccine  You may need this if you have certain conditions or if you travel or work in places where you may be exposed to hepatitis B. Haemophilus influenzae type b (Hib) vaccine  You may need this if you have certain risk factors. You may receive vaccines as individual doses or as more than one vaccine together in one shot (combination vaccines). Talk with your health care provider about the risks and benefits of combination vaccines. What tests do I need? Blood tests  Lipid and cholesterol levels. These may be checked every 5 years starting at age 33.  Hepatitis C test.  Hepatitis B test. Screening  Genital exam to check for testicular cancer or hernias.  Sexually transmitted disease (STD) testing, if you are at risk. Other tests  Tuberculosis skin test.  Vision and hearing tests.  Skin exam. Follow these instructions at home: Eating  and drinking   Eat a diet that includes fresh fruits and vegetables, whole grains, lean protein, and low-fat dairy products.  Drink enough fluid to keep your urine pale yellow.  Do not drink alcohol if: ? Your health care provider tells you not to drink. ? You are under the legal drinking age. In the [place] the legal drinking age is 58.  If  you drink alcohol: ? Limit how much you have to 0-2 drinks a day. ? Be aware of how much alcohol is in your drink. In the [place] one drink equals one 12 oz bottle of beer (355 mL), one 5 oz glass of wine (148 mL), or one 1 oz glass of hard liquor (44 mL). Lifestyle  Take daily care of your teeth and gums.  Stay active. Exercise at least 30 minutes 5 or more days of the week.  Do not use any products that contain nicotine or tobacco, such as cigarettes, e-cigarettes, and chewing tobacco. If you need help quitting, ask your health care provider.  Do not use drugs.  If you are sexually active, practice safe sex. Use a condom or other form of protection to prevent STIs (sexually transmitted infections).  Find healthy ways to cope with stress, such as: ? Meditation, yoga, or listening to music. ? Journaling. ? Talking to a trusted person. ? Spending time with friends and family. Safety  Always wear your seat belt while driving or riding in a vehicle.  Do not drive if you have been drinking alcohol.  Do not ride with someone who has been drinking.  Do not drive when you are tired or distracted.  Do not text while driving.  Wear a helmet and other protective equipment during sports activities.  If you have firearms in your house, make sure you follow all gun safety procedures.  Seek help if you have been bullied, physically abused, or sexually abused.  Use the Internet responsibly to avoid dangers such as online bullying and online sex predators. What's next?  Go to your health care provider once a year for a well check visit.  Ask your health care provider how often you should have your eyes and teeth checked.  Stay up to date on all vaccines. This information is not intended to replace advice given to you by your health care provider. Make sure you discuss any questions you have with your health care provider. Document Revised: 01/24/2018 Document Reviewed:  01/24/2018 Elsevier Patient Education  2020 [employer].

## 2019-09-29 LAB — HIV ANTIBODY (ROUTINE TESTING W REFLEX): HIV 1&2 Ab, 4th Generation: NONREACTIVE

## 2019-09-29 LAB — RPR: RPR Ser Ql: NONREACTIVE

## 2020-12-07 ENCOUNTER — Encounter: Payer: Self-pay | Admitting: Family Medicine
